# Patient Record
Sex: Male | Born: 1985 | Race: White | Hispanic: No | Marital: Single | Smoking: Current every day smoker
Health system: Southern US, Community
[De-identification: ages and names within clinical notes are randomized; demographics above are authoritative.]

## PROBLEM LIST (undated history)

## (undated) DIAGNOSIS — J45909 Unspecified asthma, uncomplicated: Secondary | ICD-10-CM

## (undated) HISTORY — PX: APPENDECTOMY: SHX54

## (undated) HISTORY — PX: WRIST SURGERY: SHX841

## (undated) HISTORY — PX: WISDOM TOOTH EXTRACTION: SHX21

---

## 2011-01-05 ENCOUNTER — Ambulatory Visit: Payer: Self-pay | Admitting: Family Medicine

## 2012-04-29 ENCOUNTER — Emergency Department: Payer: Self-pay | Admitting: Emergency Medicine

## 2012-04-29 LAB — URINALYSIS, COMPLETE
Bacteria: NONE SEEN
Ketone: NEGATIVE
Protein: NEGATIVE
RBC,UR: <1 /HPF (ref 0–5)
Specific Gravity: 1.016 (ref 1.003–1.030)
Squamous Epithelial: NONE SEEN

## 2012-04-29 LAB — COMPREHENSIVE METABOLIC PANEL
Albumin: 4.9 g/dL (ref 3.4–5.0)
Alkaline Phosphatase: 64 U/L (ref 50–136)
BUN: 13 mg/dL (ref 7–18)
Calcium, Total: 9.2 mg/dL (ref 8.5–10.1)
Creatinine: 0.88 mg/dL (ref 0.60–1.30)
EGFR (African American): >60
EGFR (Non-African Amer.): >60
Glucose: 82 mg/dL (ref 65–99)
Osmolality: 273 (ref 275–301)
SGPT (ALT): 23 U/L (ref 12–78)
Sodium: 137 mmol/L (ref 136–145)
Total Protein: 8.4 g/dL — ABNORMAL HIGH (ref 6.4–8.2)

## 2012-04-29 LAB — DRUG SCREEN, URINE
Amphetamines, Ur Screen: POSITIVE (ref ?–1000)
Barbiturates, Ur Screen: NEGATIVE (ref ?–200)
Benzodiazepine, Ur Scrn: POSITIVE (ref ?–200)
MDMA (Ecstasy)Ur Screen: NEGATIVE (ref ?–500)
Opiate, Ur Screen: NEGATIVE (ref ?–300)

## 2012-04-29 LAB — CBC
HCT: 44.9 % (ref 40.0–52.0)
HGB: 14.9 g/dL (ref 13.0–18.0)
MCH: 33.7 pg (ref 26.0–34.0)
RBC: 4.43 10*6/uL (ref 4.40–5.90)
WBC: 9 10*3/uL (ref 3.8–10.6)

## 2012-04-29 LAB — ETHANOL
Ethanol %: <0.003 % (ref 0.000–0.080)
Ethanol: <3 mg/dL

## 2012-04-29 LAB — TSH: Thyroid Stimulating Horm: 2.37 u[IU]/mL

## 2012-06-18 ENCOUNTER — Ambulatory Visit: Payer: Self-pay | Admitting: Psychiatry

## 2012-06-27 ENCOUNTER — Ambulatory Visit: Payer: Self-pay | Admitting: Psychiatry

## 2012-08-17 ENCOUNTER — Emergency Department: Payer: Self-pay | Admitting: Emergency Medicine

## 2012-08-17 LAB — DRUG SCREEN, URINE
Amphetamines, Ur Screen: POSITIVE (ref ?–1000)
Barbiturates, Ur Screen: NEGATIVE (ref ?–200)
Benzodiazepine, Ur Scrn: NEGATIVE (ref ?–200)
Cannabinoid 50 Ng, Ur ~~LOC~~: POSITIVE (ref ?–50)
Methadone, Ur Screen: POSITIVE (ref ?–300)
Opiate, Ur Screen: NEGATIVE (ref ?–300)

## 2012-08-17 LAB — COMPREHENSIVE METABOLIC PANEL
Anion Gap: 5 — ABNORMAL LOW (ref 7–16)
Bilirubin,Total: 0.6 mg/dL (ref 0.2–1.0)
Calcium, Total: 9.2 mg/dL (ref 8.5–10.1)
Chloride: 108 mmol/L — ABNORMAL HIGH (ref 98–107)
Creatinine: 0.82 mg/dL (ref 0.60–1.30)
EGFR (African American): >60
EGFR (Non-African Amer.): >60
Potassium: 3.7 mmol/L (ref 3.5–5.1)
SGOT(AST): 16 U/L (ref 15–37)
SGPT (ALT): 20 U/L (ref 12–78)
Sodium: 141 mmol/L (ref 136–145)
Total Protein: 7.8 g/dL (ref 6.4–8.2)

## 2012-08-17 LAB — URINALYSIS, COMPLETE
Glucose,UR: NEGATIVE mg/dL (ref 0–75)
Leukocyte Esterase: NEGATIVE
Nitrite: NEGATIVE
Ph: 6 (ref 4.5–8.0)
Protein: 100
RBC,UR: 5 /HPF (ref 0–5)

## 2012-08-17 LAB — SALICYLATE LEVEL: Salicylates, Serum: 2.4 mg/dL

## 2012-08-17 LAB — CBC
HCT: 37.3 % — ABNORMAL LOW (ref 40.0–52.0)
MCH: 34.1 pg — ABNORMAL HIGH (ref 26.0–34.0)
MCHC: 34.1 g/dL (ref 32.0–36.0)
Platelet: 176 10*3/uL (ref 150–440)
RBC: 3.73 10*6/uL — ABNORMAL LOW (ref 4.40–5.90)
WBC: 8.1 10*3/uL (ref 3.8–10.6)

## 2012-08-17 LAB — ACETAMINOPHEN LEVEL: Acetaminophen: <2 ug/mL

## 2012-08-17 LAB — ETHANOL: Ethanol: <3 mg/dL

## 2012-08-17 LAB — TSH: Thyroid Stimulating Horm: 0.44 u[IU]/mL — ABNORMAL LOW

## 2012-11-21 ENCOUNTER — Emergency Department: Payer: Self-pay | Admitting: Emergency Medicine

## 2012-11-24 ENCOUNTER — Emergency Department: Payer: Self-pay | Admitting: Internal Medicine

## 2013-10-17 ENCOUNTER — Emergency Department: Payer: Self-pay | Admitting: Student

## 2014-06-19 NOTE — Consult Note (Signed)
Brief Consult Note: Diagnosis: Polysubstance dependence,Status post overdose.   Recommend further assessment or treatment.   Comments: Mr. Alex Garrett was brought to ER with AMS. I attempted to interview him twice today. He is too sedated. Will attempt again tomorrow.  Electronic Signatures: Kristine LineaPucilowska, Kayleanna Lorman (MD)  (Signed 03-Mar-14 16:36)  Authored: Brief Consult Note   Last Updated: 03-Mar-14 16:36 by Kristine LineaPucilowska, Staisha Winiarski (MD)

## 2014-06-19 NOTE — Consult Note (Signed)
PATIENT NAME:  Alex Garrett, Alex Garrett MR#:  [id number] DATE OF BIRTH:  08/20/1985  DATE OF CONSULTATION:  08/17/2012  REFERRING PHYSICIAN:    CONSULTING PHYSICIAN:  Jadia Capers K. Guss Bundehalla, MD  PLACE OF DICTATION:  [facility], [facility], [place], [place].  AGE:  29 years.  SEX:  Male.  RACE:  White.  The patient was seen for assessment in [facility].    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  The patient is single, never married and lives with his parents who are both 29 years old and both are retired, mother being a Engineer, siteschool teacher and father worked in a [employer] company.  The patient was brought to the Emergency Room on IVC, escorted by police because he was very emotionally agitated.  The patient reports that he got into an argument with his father last night and he was very agitated and upset and probably aggressive for which father brought him to the Emergency Room.  According to information obtained from the charts ED physician had to give patient Haldol 5 mg IM to help him calm down.   PAST PSYCHIATRIC HISTORY:  The patient reports that he was being followed by Dr. Bethann PunchesMark Miller, primary PCP and gets Adderall 20 mg p.o. q. a.m. for being in school and to help him focus and pay attention.  He is on trazodone 50 mg p.o. at bedtime to help him rest.  In addition, he is on Prozac 20 mg p.o. daily.  According to information obtained from the chart, the patient was positive for amphetamines and THC.  The patient reports he is on Adderall which is the cause of urine drug screen being positive for amphetamines.  He smoked THC 2 weeks ago and does not smoke it on a regular basis.  Denies any other street or prescription drug abuse.  On one occasion several years ago he tried to overdose on Xanax which he got from the streets and was [facility] and was observed and let go.   MENTAL STATUS EXAMINATION:  Alert and oriented, pleasant and  cooperative.  No agitation.  Denies feeling depressed.  Denies feeling hopeless or helpless.  No psychosis.  Cognition intact.  General knowledge of information is fair.  Admits that he did have conflicts with his father for which he got agitated and which was the reason for him to be [facility].  Denies any ideas of plans to hurt himself or others and contracts for safety.   IMPRESSION:  Impulse control disorder, attention deficit disorder adult onset, depressive disorder, not otherwise specified, tetrahydrocannabinol abuse.  RECOMMENDATIONS:  The patient will be started back on his medications, that is Prozac 20 mg daily and trazodone 50 mg at bedtime.  The patient will be evaluated by Mr. Theodosia PalingKent Smith on Monday, 08/19/2012 for appropriate disposition and the parents will be called and discussed about the same.      ____________________________ Jannet MantisSurya K. Guss Bundehalla, MD skc:ea D: 08/17/2012 21:06:26 ET T: 08/18/2012 00:11:53 ET JOB#: [id number]  cc: Monika SalkSurya K. Guss Bundehalla, MD, <Dictator> Beau FannySURYA K Jakaya Jacobowitz MD ELECTRONICALLY SIGNED 08/18/2012 18:28

## 2014-06-19 NOTE — Consult Note (Signed)
PATIENT NAME:  Alex Garrett, Alex Garrett MR#:  [id number] DATE OF BIRTH:  1985/07/17  DATE OF CONSULTATION:  08/19/2012  CONSULTING PHYSICIAN:  Izola PriceFrances C. Jaclynn MajorGreason, MD  HISTORY OF PRESENT ILLNESS:  Alex Garrett is a 29 year old white male who was seen by Dr. Guss Bundehalla on 08/17/2012 secondary to him being brought to the Emergency Room on IVC escorted by police officers. He reported that he had gotten into an argument with his father and he was very agitated and upset and probably aggressive for which the father brought him to the ED. The ED physician gave him Haldol 5 mg IM to help him calm down. He was evaluated by Dr. Guss Bundehalla and I have reevaluated him this a.m. He reports that his PCP gives him Adderall to help him focus and pay attention. He is on trazodone at bedtime 50 mg to help him sleep. He is on Prozac 20 mg p.o. daily.   According to the chart, he was positive for amphetamines and THC. He reports that he takes the Adderall which is the amphetamines. He smoked THC 2 weeks ago and does not smoke it on a regular basis. He denies any other prescription drug or street drug. He reports that his father is sick with cancer and he worries about him a lot and he believes that the trazodone and Prozac helps him.   On my mental status examination, Alex Garrett is alert, oriented, focused and cooperative in the interview. There is absolutely no agitation or no suicidal ideation, intent or plan. There is no homicidal ideation, intent or plan. He denies any delusions or hallucinations. Thought processes are organized and goal directed. He reports that he has good family support. He likes to fish with his father and brother. He sometimes does needlepoint with his mother. He is cognitively intact. 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 He denies any plans to hurt himself or others and he contracts.   IMPRESSION:   1.  Impulse control disorder. 2.  Attention  deficit disorder, adult onset.  3.  Depressive disorder, not otherwise specified.  4.  Tetrahydrocannabinol abuse.   RECOMMENDATIONS:   1.  Continue his medicines, but I recommended that his Prozac be reduced to 10 mg p.o. daily and if he continues with these outbursts, he likely should be tapered off of it.  2.  If the patient remains stable and contracts and appointments are set up, he will be discharged to his parents.    ____________________________ Izola PriceFrances C. Jaclynn MajorGreason, MD fcg:si D: 08/19/2012 17:13:31 ET T: 08/19/2012 20:10:45 ET JOB#: [id number]  cc: Izola PriceFrances C. Jaclynn MajorGreason, MD, <Dictator> Maryan PulsFRANCES C Curly Mackowski MD ELECTRONICALLY SIGNED 08/29/2012 12:03

## 2014-06-19 NOTE — Consult Note (Signed)
Brief Consult Note: Diagnosis: Impulse Control DO, ADD by HX.   Patient was seen by consultant.   Consult note dictated.   Recommend further assessment or treatment.   Comments: Pt admitted after he took extra pills of xanax and adderall. He stated that went fishing with girl friend. he stated that he became too sedated and she was cared so she called 911. he was prescribed Adderall in the past by his PCP Dr [place]ann GibbsFinch [place] [facility] [place] when he will was living with his mother. However, he has now relocated with his father and was given Adderall XR 20mg  po qdaily. Pt is unable to afford the medications and he has stopped the pills. 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 Has the relationship for the past 1 year. Stated that he does not use other drugs or alcohol.  he is not having SI or Plans.   MSE- Beverely Pacehinly built male appeared younger than stated age. calm and cooperative. Maintained good eye contact. speech was low in tone and volume. Mood was fine, affect was congruent, tp- logical goal directed, tc- non delusional. Denied SI/HI or plans.   DX- Impulse Control DO ADD by HX  Plan; Will d/C IVC Pt contracted for safety. Will be discharged home and he will follow with [facility].  He agreed with plan.  No acute issues noted.  No new meds at this time..  Electronic Signatures: Rhunette CroftFaheem, Uzma S (MD)  (Signed 04-Mar-14 13:26)  Authored: Brief Consult Note   Last Updated: 04-Mar-14 13:26 by Rhunette CroftFaheem, Uzma S (MD)

## 2014-09-25 IMAGING — CR DG CHEST 1V PORT
1 series · 2 of 2 positions shown · non-contrast
Comparison: none

REASON FOR EXAM: OVERDOSE
COMMENTS:

PROCEDURE:     DXR - DXR PORTABLE CHEST SINGLE VIEW  - April 29, 2012  [DATE]
RESULT:     The lungs are clear. The heart and pulmonary vessels are normal.
The bony and mediastinal structures are unremarkable. There is no effusion.
There is no pneumothorax or evidence of congestive failure.

[Series 1: ap · 0.17mm/px · 2 of 2 slices shown]
[im 1/2]
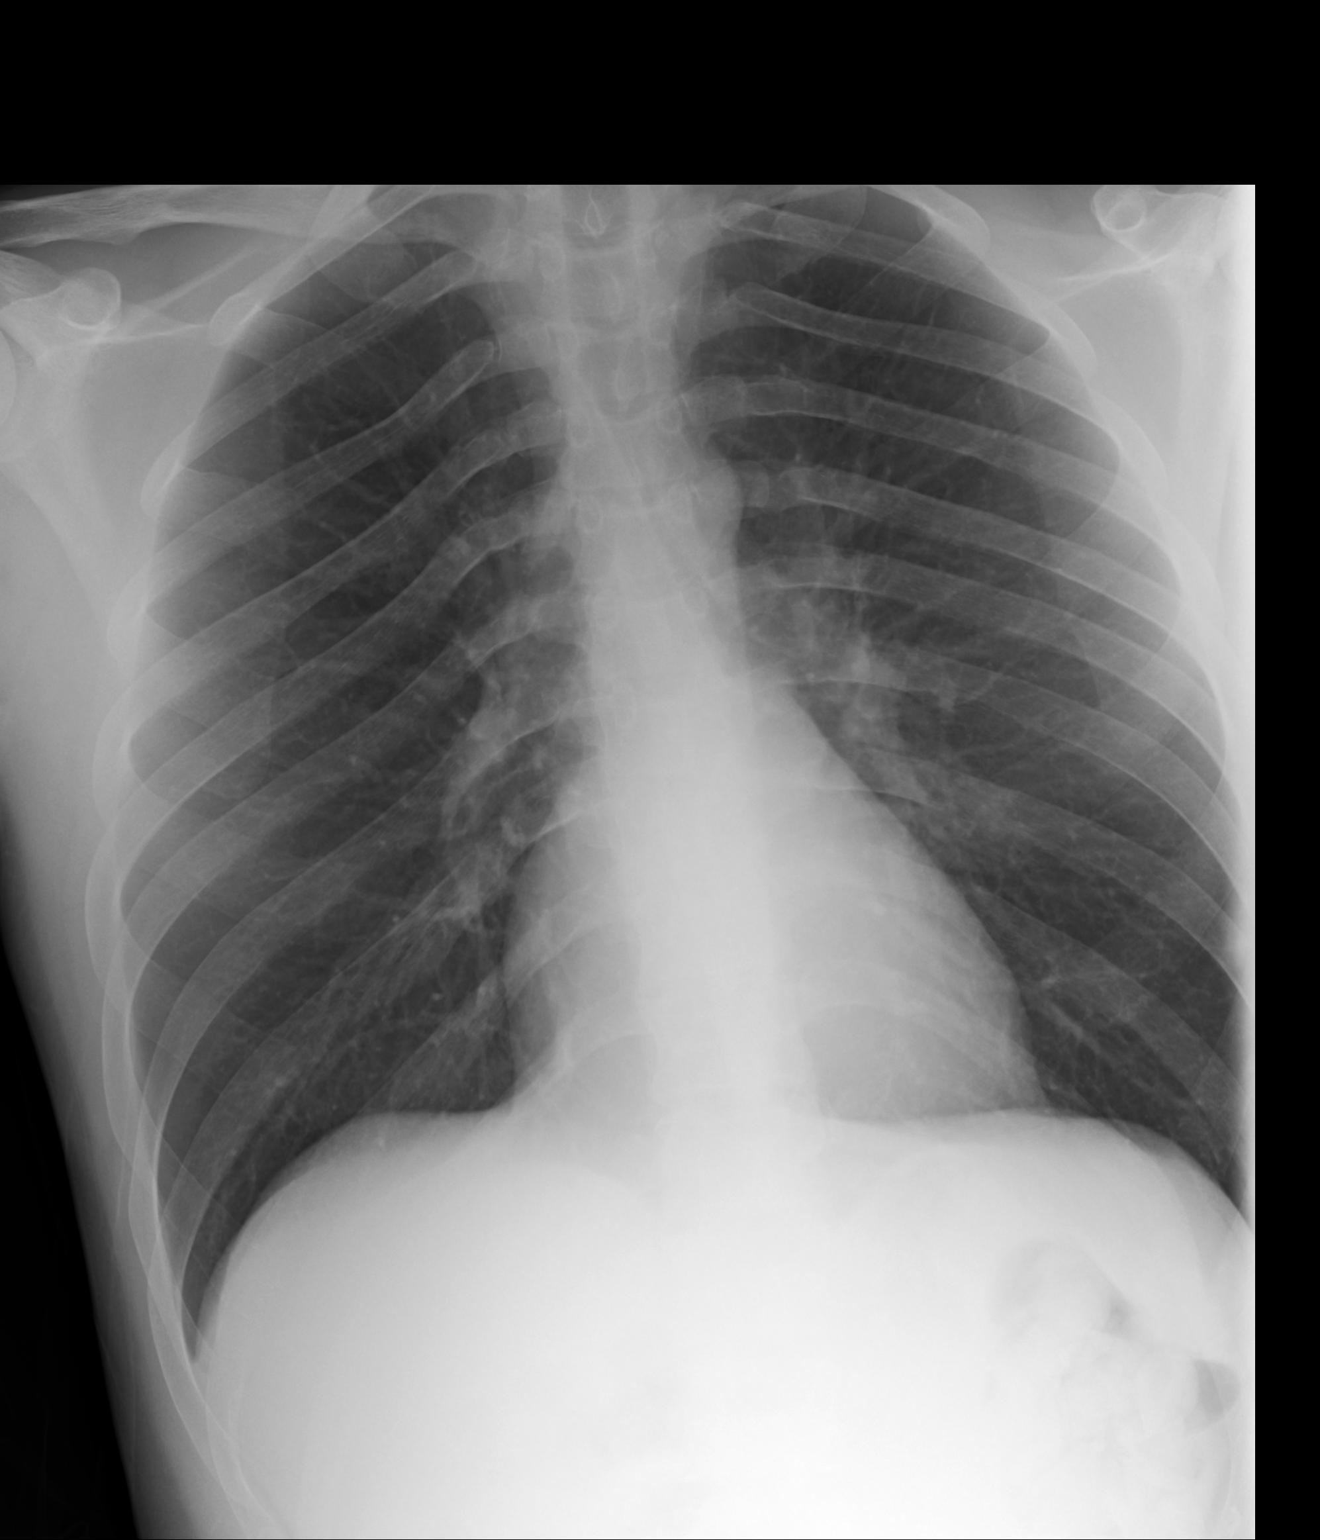
[im 2/2]
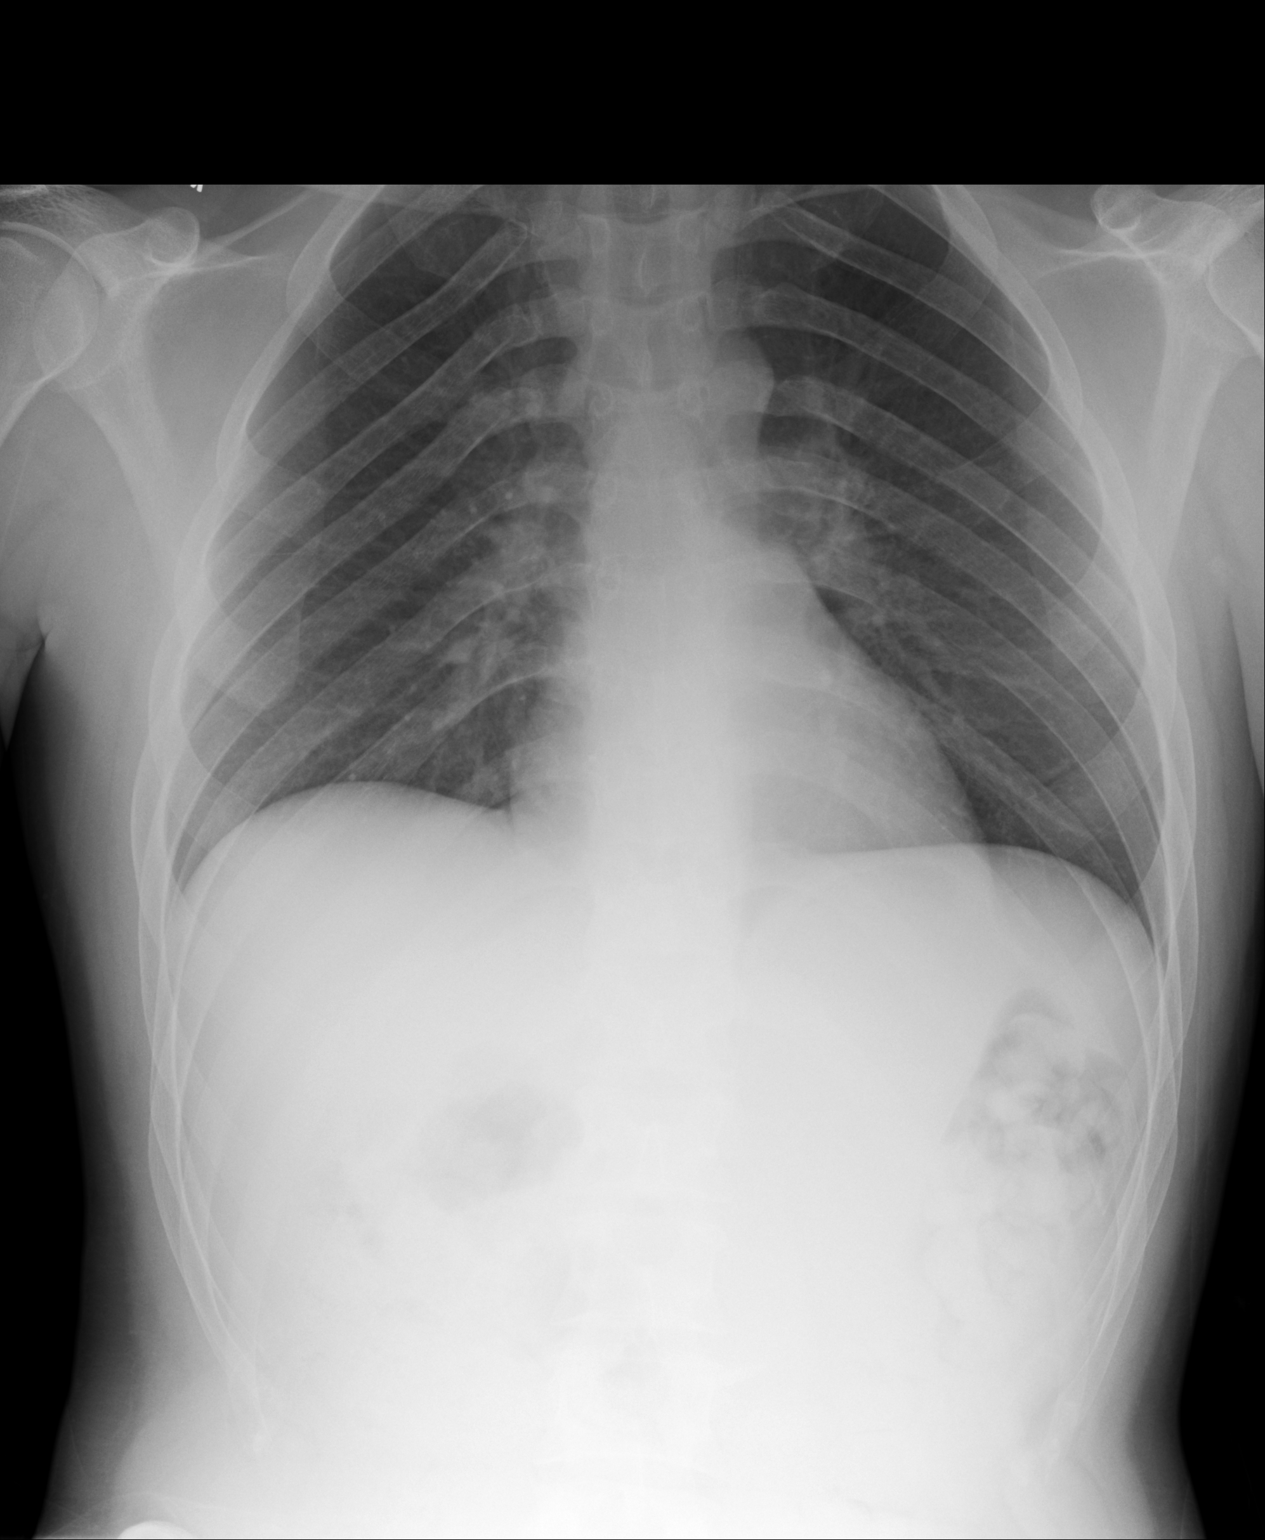

[2 of 2 positions shown; findings below may reference images not displayed]

IMPRESSION: No acute cardiopulmonary disease.

[REDACTED]

## 2014-10-18 ENCOUNTER — Encounter: Payer: Self-pay | Admitting: *Deleted

## 2014-10-18 ENCOUNTER — Emergency Department: Payer: Self-pay

## 2014-10-18 ENCOUNTER — Emergency Department
Admission: EM | Admit: 2014-10-18 | Discharge: 2014-10-18 | Disposition: A | Discharge status: Home / Self Care | Payer: Self-pay | Attending: Emergency Medicine | Admitting: Emergency Medicine

## 2014-10-18 DIAGNOSIS — Y998 Other external cause status: Secondary | ICD-10-CM | POA: Insufficient documentation

## 2014-10-18 DIAGNOSIS — W010XXA Fall on same level from slipping, tripping and stumbling without subsequent striking against object, initial encounter: Secondary | ICD-10-CM | POA: Insufficient documentation

## 2014-10-18 DIAGNOSIS — Y9389 Activity, other specified: Secondary | ICD-10-CM | POA: Insufficient documentation

## 2014-10-18 DIAGNOSIS — S0990XA Unspecified injury of head, initial encounter: Secondary | ICD-10-CM | POA: Insufficient documentation

## 2014-10-18 DIAGNOSIS — K0889 Other specified disorders of teeth and supporting structures: Secondary | ICD-10-CM

## 2014-10-18 DIAGNOSIS — S161XXA Strain of muscle, fascia and tendon at neck level, initial encounter: Secondary | ICD-10-CM | POA: Insufficient documentation

## 2014-10-18 DIAGNOSIS — K088 Other specified disorders of teeth and supporting structures: Secondary | ICD-10-CM | POA: Insufficient documentation

## 2014-10-18 DIAGNOSIS — Z720 Tobacco use: Secondary | ICD-10-CM | POA: Insufficient documentation

## 2014-10-18 DIAGNOSIS — Y9289 Other specified places as the place of occurrence of the external cause: Secondary | ICD-10-CM | POA: Insufficient documentation

## 2014-10-18 HISTORY — DX: Unspecified asthma, uncomplicated: J45.909

## 2014-10-18 MED ORDER — AMOXICILLIN 500 MG PO CAPS: 500.0000 mg | ORAL_CAPSULE | Freq: Three times a day (TID) | ORAL | Status: DC

## 2014-10-18 MED ORDER — CYCLOBENZAPRINE HCL 10 MG PO TABS: 10.0000 mg | ORAL_TABLET | Freq: Three times a day (TID) | ORAL | Status: DC | PRN

## 2014-10-18 NOTE — ED Provider Notes (Signed)
[facility] Emergency Department Provider Note  ____________________________________________  Time seen: Approximately 5:14 PM  I have reviewed the triage vital signs and the nursing notes.   HISTORY  Chief Complaint Back Pain    HPI Alex Garrett is a 29 y.o. male patient complaining of posterior frontal head pain and vertigo. Patient also complaining of neck pain. Patient say he slipped and fell his neurologist 2 months ago with loss of consciousness but since then he's experiencing neck pain and intermittent vertigo. Patient also complaining of sinus congestion and dental pain. Patient stated he had tooth #17 removed last month. Patient states since the procedure he noticed increased gum swelling and pain. Patient is rating his pain as a 6/10. Past Medical History  Diagnosis Date  . Asthma     There are no active problems to display for this patient.   Past Surgical History  Procedure Laterality Date  . Wrist surgery    . Appendectomy    . Wisdom tooth extraction      No current outpatient prescriptions on file.  Allergies Review of patient's allergies indicates no known allergies.  No family history on file.  Social History Social History  Substance Use Topics  . Smoking status: Current Every Day Smoker  . Smokeless tobacco: None  . Alcohol Use: Yes    Review of Systems Constitutional: No fever/chills Eyes: No visual changes. ENT: No sore throat. Cardiovascular: Denies chest pain. Respiratory: Denies shortness of breath. Gastrointestinal: No abdominal pain.  No nausea, no vomiting.  No diarrhea.  No constipation. Genitourinary: Negative for dysuria. Musculoskeletal: Neck pain.. Skin: Negative for rash. Neurological: Frontal and posterior  Headaches, but denies focal weakness or numbness. States intermitting vertigo. 10-point ROS otherwise negative.  ____________________________________________   PHYSICAL EXAM:  VITAL  SIGNS: ED Triage Vitals  Enc Vitals Group     BP 10/18/14 1650 151/90 mmHg     Pulse Rate 10/18/14 1650 105     Resp 10/18/14 1650 18     Temp 10/18/14 1650 98.2 F (36.8 C)     Temp Source 10/18/14 1650 Oral     SpO2 10/18/14 1650 98 %     Weight 10/18/14 1650 148 lb (67.132 kg)     Height 10/18/14 1650  (1.651 m)     Head Cir --      Peak Flow --      Pain Score 10/18/14 1656 6     Pain Loc --      Pain Edu? --      Excl. in GC? --     Constitutional: Alert and oriented. Well appearing and in no acute distress. Eyes: Conjunctivae are normal. PERRL. EOMI. Head: Atraumatic. Nose: No congestion/rhinnorhea. Mouth/Throat: Mucous membranes are moist.  Oropharynx non-erythematous. Gingival edema and tooth #17. Neck: No stridor. Positive cervical spine tenderness to palpation at cervical process #6. Hematological/Lymphatic/Immunilogical: No cervical lymphadenopathy. Cardiovascular: Normal rate, regular rhythm. Grossly normal heart sounds.  Good peripheral circulation. Respiratory: Normal respiratory effort.  No retractions. Lungs CTAB. Gastrointestinal: Soft and nontender. No distention. No abdominal bruits. No CVA tenderness. Musculoskeletal: No lower extremity tenderness nor edema.  No joint effusions. Neurologic:  Normal speech and language. No gross focal neurologic deficits are appreciated. No gait instability. Skin:  Skin is warm, dry and intact. No rash noted. Psychiatric: Mood and affect are normal. Speech and behavior are normal.  ____________________________________________   LABS (all labs ordered are listed, but only abnormal results are displayed)  Labs Reviewed -  No data to display ____________________________________________  EKG   ____________________________________________  RADIOLOGY  CT head and neck unremarkable. ____________________________________________   PROCEDURES  Procedure(s) performed: None  Critical Care performed:  No  ____________________________________________   INITIAL IMPRESSION / ASSESSMENT AND PLAN / ED COURSE  Pertinent labs & imaging results that were available during my care of the patient were reviewed by me and considered in my medical decision making (see chart for details).  Cervical strain. Dental pain. No acute findings the CT results with patient. ____________________________________________   FINAL CLINICAL IMPRESSION(S) / ED DIAGNOSES  Final diagnoses:  Cervical strain, acute, initial encounter  Pain, dental      Joni Reining, PA-C 10/18/14 1914  Loleta Rose, MD 10/18/14 2351

## 2014-10-18 NOTE — Discharge Instructions (Signed)
Advised to follow up with treating Dentist.

## 2014-10-18 NOTE — ED Notes (Signed)
Pt ambulatory to triage with steady gait. Pt states that he has a pain in his upper spine (has been off and on since March-pain really started back in May when he fell and hit his head). Pt also wants to be evaluated for pain in his left jaw (states his wisdom teeth were removed in July, has become worse over the past several days.)

## 2014-11-11 ENCOUNTER — Other Ambulatory Visit: Payer: Self-pay | Admitting: Internal Medicine

## 2014-11-11 DIAGNOSIS — M5010 Cervical disc disorder with radiculopathy, unspecified cervical region: Secondary | ICD-10-CM

## 2014-11-14 ENCOUNTER — Ambulatory Visit: Admission: RE | Admit: 2014-11-14 | Payer: Self-pay | Source: Ambulatory Visit

## 2015-02-16 ENCOUNTER — Emergency Department
Admission: EM | Admit: 2015-02-16 | Discharge: 2015-02-16 | Disposition: A | Discharge status: Home / Self Care | Payer: Self-pay | Attending: Emergency Medicine | Admitting: Emergency Medicine

## 2015-02-16 ENCOUNTER — Encounter: Payer: Self-pay | Admitting: Urgent Care

## 2015-02-16 DIAGNOSIS — R079 Chest pain, unspecified: Secondary | ICD-10-CM

## 2015-02-16 DIAGNOSIS — R6883 Chills (without fever): Secondary | ICD-10-CM

## 2015-02-16 DIAGNOSIS — R05 Cough: Secondary | ICD-10-CM | POA: Insufficient documentation

## 2015-02-16 DIAGNOSIS — M791 Myalgia: Secondary | ICD-10-CM | POA: Insufficient documentation

## 2015-02-16 DIAGNOSIS — F17200 Nicotine dependence, unspecified, uncomplicated: Secondary | ICD-10-CM | POA: Insufficient documentation

## 2015-02-16 DIAGNOSIS — R109 Unspecified abdominal pain: Secondary | ICD-10-CM

## 2015-02-16 DIAGNOSIS — R202 Paresthesia of skin: Secondary | ICD-10-CM | POA: Insufficient documentation

## 2015-02-16 DIAGNOSIS — R52 Pain, unspecified: Secondary | ICD-10-CM

## 2015-02-16 DIAGNOSIS — M542 Cervicalgia: Secondary | ICD-10-CM | POA: Insufficient documentation

## 2015-02-16 DIAGNOSIS — R059 Cough, unspecified: Secondary | ICD-10-CM

## 2015-02-16 DIAGNOSIS — Z792 Long term (current) use of antibiotics: Secondary | ICD-10-CM | POA: Insufficient documentation

## 2015-02-16 LAB — CBC
HEMATOCRIT: 46.5 % (ref 40.0–52.0)
HEMOGLOBIN: 15.3 g/dL (ref 13.0–18.0)
MCH: 34 pg (ref 26.0–34.0)
MCHC: 33 g/dL (ref 32.0–36.0)
MCV: 103 fL — AB (ref 80.0–100.0)
Platelets: 244 10*3/uL (ref 150–440)
RBC: 4.51 MIL/uL (ref 4.40–5.90)
RDW: 13.6 % (ref 11.5–14.5)
WBC: 9.6 10*3/uL (ref 3.8–10.6)

## 2015-02-16 LAB — URINALYSIS COMPLETE WITH MICROSCOPIC (ARMC ONLY)
BACTERIA UA: NONE SEEN
BILIRUBIN URINE: NEGATIVE
GLUCOSE, UA: NEGATIVE mg/dL
Hgb urine dipstick: NEGATIVE
Ketones, ur: NEGATIVE mg/dL
Leukocytes, UA: NEGATIVE
Nitrite: NEGATIVE
Protein, ur: NEGATIVE mg/dL
SQUAMOUS EPITHELIAL / LPF: NONE SEEN
Specific Gravity, Urine: 1.01 (ref 1.005–1.030)
pH: 6 (ref 5.0–8.0)

## 2015-02-16 LAB — COMPREHENSIVE METABOLIC PANEL
ALK PHOS: 56 U/L (ref 38–126)
ALT: 16 U/L — AB (ref 17–63)
AST: 20 U/L (ref 15–41)
Albumin: 5.4 g/dL — ABNORMAL HIGH (ref 3.5–5.0)
Anion gap: 5 (ref 5–15)
BILIRUBIN TOTAL: 0.4 mg/dL (ref 0.3–1.2)
BUN: 9 mg/dL (ref 6–20)
CALCIUM: 9.4 mg/dL (ref 8.9–10.3)
CO2: 28 mmol/L (ref 22–32)
CREATININE: 0.72 mg/dL (ref 0.61–1.24)
Chloride: 105 mmol/L (ref 101–111)
GFR calc Af Amer: >60 mL/min (ref >60)
Glucose, Bld: 106 mg/dL — ABNORMAL HIGH (ref 65–99)
POTASSIUM: 4.2 mmol/L (ref 3.5–5.1)
Sodium: 138 mmol/L (ref 135–145)
TOTAL PROTEIN: 8.7 g/dL — AB (ref 6.5–8.1)

## 2015-02-16 LAB — CK: CK TOTAL: 65 U/L (ref 49–397)

## 2015-02-16 NOTE — ED Notes (Signed)
Stafford,MD consulted. MD made aware of presenting complaints and triage assessment. MD with VORB for: CBC, CMP, CK, UA, and EKG. Orders to be entered and carried by this RN.

## 2015-02-16 NOTE — Discharge Instructions (Signed)

## 2015-02-16 NOTE — ED Notes (Signed)
Patient presents with a constellation of symptoms that have been intermittently present x 1 year. PCP is Hyacinth MeekerMiller and he has been unable to make a diagnosis; reports that MD thinks it is a sinus infection, muscle spasms, or nerve damage. Patient is reporting multiple organ systems. Patient with c/o numbness to LEFT side of his head, LEFT side CP, LLQ abd pain, difficulty urinating with "cloudy" urine, pain in his extremities and neck. Symptoms come and go and he is not experiencing them all at this point.

## 2015-02-16 NOTE — ED Provider Notes (Addendum)
[facility] Emergency Department Provider Note  ____________________________________________  Time seen: Approximately 945 PM  I have reviewed the triage vital signs and the nursing notes.   HISTORY  Chief Complaint Generalized Body Aches; Abdominal Pain; and Neck Pain    HPI Alex Garrett is a 29 y.o. male with a history of asthma who is presenting today with multiple complaints. He says for about the past year he has had intermittent abdominal pain to the left side of his abdomen. Chest pain. Cough. Cloudy urine as well as muscle aches diffusely. He says that he came in today because he is also been having chills for the past year that have worsened over the past day. His girlfriend is at the bedside and said he had a subjective fever as well. The patient has had no Tylenol or ibuprofen earlier today. Also states a heaviness to the right side of his face without any explicit numbness or tingling. He suspects that this may be from past head trauma. He says he has had an intracranial hemorrhage on the past when he fell off a roof. The patient also has a history of IV drug use and says he has not used over the past 6 months.He says that both him and his girlfriend have tested negative for hep C as well as HIV since he stopped using drugs. Patient is also concerned because of a history of blood cancers in his family. Says that he has had intermittently swollen lymph nodes in his neck.   Past Medical History  Diagnosis Date  . Asthma     There are no active problems to display for this patient.   Past Surgical History  Procedure Laterality Date  . Wrist surgery    . Appendectomy    . Wisdom tooth extraction      Current Outpatient Rx  Name  Route  Sig  Dispense  Refill  . amoxicillin (AMOXIL) 500 MG capsule   Oral   Take 1 capsule (500 mg total) by mouth 3 (three) times daily.   30 capsule   0   . cyclobenzaprine (FLEXERIL) 10 MG tablet   Oral  Take 1 tablet (10 mg total) by mouth every 8 (eight) hours as needed for muscle spasms.   15 tablet   0     Allergies Review of patient's allergies indicates no known allergies.  No family history on file.  Social History Social History  Substance Use Topics  . Smoking status: Current Every Day Smoker  . Smokeless tobacco: None  . Alcohol Use: Yes    Review of Systems Constitutional: No fever/chills Eyes: No visual changes. ENT: No sore throat. Cardiovascular: Intermittent left-sided chest pain which is nonradiating and does not have any at this time.  Respiratory: Intermittent nonproductive cough. Gastrointestinal: no vomiting.  No diarrhea.  No constipation. Genitourinary: Negative for dysuria. Musculoskeletal: Diffuse muscle aches. Skin: Negative for rash. Neurological: Negative for  focal weakness or numbness.  10-point ROS otherwise negative.  ____________________________________________   PHYSICAL EXAM:  VITAL SIGNS: ED Triage Vitals  Enc Vitals Group     BP 02/16/15 1934 139/90 mmHg     Pulse Rate 02/16/15 1934 100     Resp 02/16/15 1934 16     Temp 02/16/15 1934 98.4 F (36.9 C)     Temp Source 02/16/15 1934 Oral     SpO2 02/16/15 1934 99 %     Weight 02/16/15 1934 145 lb (65.772 kg)     Height  02/16/15 1934 5\' 11"  (1.803 m)     Head Cir --      Peak Flow --      Pain Score 02/16/15 1934 4     Pain Loc --      Pain Edu? --      Excl. in GC? --     Constitutional: Alert and oriented. Well appearing and in no acute distress. Eyes: Conjunctivae are normal. PERRL. EOMI. Head: Atraumatic. Nose: No congestion/rhinnorhea. Mouth/Throat: Mucous membranes are moist.  Oropharynx non-erythematous. Neck: No stridor.   Hematological/Lymphatic/Immunilogical: No cervical lymphadenopathy. Cardiovascular: Normal rate, regular rhythm. Grossly normal heart sounds.  Good peripheral circulation. No spliter hemorrhages or Osler's nodes. Respiratory: Normal  respiratory effort.  No retractions. Lungs CTAB. Gastrointestinal: Soft and nontender. No distention. No abdominal bruits. No CVA tenderness. Musculoskeletal: No lower extremity tenderness nor edema.  No joint effusions. Neurologic:  Normal speech and language. No gross focal neurologic deficits are appreciated. No gait instability. Sensation intact to light touch to bilateral face. Skin:  Skin is warm, dry and intact. No rash noted. Psychiatric: Mood and affect are normal. Speech and behavior are normal.  ____________________________________________   LABS (all labs ordered are listed, but only abnormal results are displayed)  Labs Reviewed  CBC - Abnormal; Notable for the following:    MCV 103.0 (*)    All other components within normal limits  COMPREHENSIVE METABOLIC PANEL - Abnormal; Notable for the following:    Glucose, Bld 106 (*)    Total Protein 8.7 (*)    Albumin 5.4 (*)    ALT 16 (*)    All other components within normal limits  URINALYSIS COMPLETEWITH MICROSCOPIC ([facility] ONLY) - Abnormal; Notable for the following:    Color, Urine STRAW (*)    APPearance CLEAR (*)    All other components within normal limits  CULTURE, BLOOD (ROUTINE X 2)  CULTURE, BLOOD (ROUTINE X 2)  CK   ____________________________________________  EKG  ED ECG REPORT I, Arelia LongestSchaevitz,  Nikkia Devoss M, the attending physician, personally viewed and interpreted this ECG.   Date: 02/16/2015  EKG Time: 1943  Rate: 87  Rhythm: normal sinus rhythm  Axis: Rightward axis  Intervals:none  ST&T Change: No ST elevation or depressions. No abnormal T-wave inversions.  ____________________________________________  RADIOLOGY   ____________________________________________   PROCEDURES   ____________________________________________   INITIAL IMPRESSION / ASSESSMENT AND PLAN / ED COURSE  Pertinent labs & imaging results that were available during my care of the patient were reviewed by me and considered  in my medical decision making (see chart for details).  Patient with reassuring exam as well as lab workup. I did draw blood cultures because of concern for endocarditis because of the increasing chills. However, the patient did not a fever here. Does not have an elevated white blood cell count. Recommended that the patient follow-up with Dr. Hyacinth MeekerMiller. I do not see reason to keep him in the emergency department for further workup at this time. I believe he may be an outpatient. I cleaned this plan with patient as well as my concerns and he understands the plan and is willing to comply. ____________________________________________   FINAL CLINICAL IMPRESSION(S) / ED DIAGNOSES  Chills. Abdominal pain. Paresthesia. Chest pain. Cough. Body aches.    Myrna Blazeravid Matthew Kandi Brusseau, MD 02/16/15 2213  Myrna Blazeravid Matthew Obe Ahlers, MD 02/16/15 2214

## 2015-02-21 LAB — CULTURE, BLOOD (ROUTINE X 2)
CULTURE: NO GROWTH
Culture: NO GROWTH

## 2017-02-01 ENCOUNTER — Emergency Department: Payer: Self-pay

## 2017-02-01 ENCOUNTER — Emergency Department
Admission: EM | Admit: 2017-02-01 | Discharge: 2017-02-02 | Discharge status: Against Medical Advice | Payer: Self-pay | Attending: Student in an Organized Health Care Education/Training Program | Admitting: Student in an Organized Health Care Education/Training Program

## 2017-02-01 DIAGNOSIS — R002 Palpitations: Secondary | ICD-10-CM | POA: Insufficient documentation

## 2017-02-01 DIAGNOSIS — F1510 Other stimulant abuse, uncomplicated: Secondary | ICD-10-CM | POA: Insufficient documentation

## 2017-02-01 DIAGNOSIS — J45909 Unspecified asthma, uncomplicated: Secondary | ICD-10-CM | POA: Insufficient documentation

## 2017-02-01 DIAGNOSIS — Z5320 Procedure and treatment not carried out because of patient's decision for unspecified reasons: Secondary | ICD-10-CM | POA: Insufficient documentation

## 2017-02-01 DIAGNOSIS — F17200 Nicotine dependence, unspecified, uncomplicated: Secondary | ICD-10-CM | POA: Insufficient documentation

## 2017-02-01 LAB — BASIC METABOLIC PANEL
ANION GAP: 12 (ref 5–15)
BUN: 12 mg/dL (ref 6–20)
CALCIUM: 9.5 mg/dL (ref 8.9–10.3)
CHLORIDE: 101 mmol/L (ref 101–111)
CO2: 25 mmol/L (ref 22–32)
Creatinine, Ser: 0.9 mg/dL (ref 0.61–1.24)
GFR calc Af Amer: >60 mL/min (ref >60)
GFR calc non Af Amer: >60 mL/min (ref >60)
GLUCOSE: 111 mg/dL — AB (ref 65–99)
Potassium: 3.8 mmol/L (ref 3.5–5.1)
Sodium: 138 mmol/L (ref 135–145)

## 2017-02-01 LAB — URINALYSIS, COMPLETE (UACMP) WITH MICROSCOPIC
BACTERIA UA: NONE SEEN
Bilirubin Urine: NEGATIVE
Glucose, UA: NEGATIVE mg/dL
Hgb urine dipstick: NEGATIVE
KETONES UR: NEGATIVE mg/dL
LEUKOCYTES UA: NEGATIVE
Nitrite: NEGATIVE
PROTEIN: NEGATIVE mg/dL
SQUAMOUS EPITHELIAL / LPF: NONE SEEN
Specific Gravity, Urine: 1.004 — ABNORMAL LOW (ref 1.005–1.030)
pH: 7 (ref 5.0–8.0)

## 2017-02-01 LAB — CBC
HEMATOCRIT: 40.3 % (ref 40.0–52.0)
HEMOGLOBIN: 13.8 g/dL (ref 13.0–18.0)
MCH: 33.8 pg (ref 26.0–34.0)
MCHC: 34.2 g/dL (ref 32.0–36.0)
MCV: 99 fL (ref 80.0–100.0)
Platelets: 205 10*3/uL (ref 150–440)
RBC: 4.07 MIL/uL — ABNORMAL LOW (ref 4.40–5.90)
RDW: 12.9 % (ref 11.5–14.5)
WBC: 8.6 10*3/uL (ref 3.8–10.6)

## 2017-02-01 LAB — TROPONIN I: Troponin I: <0.03 ng/mL (ref <0.03)

## 2017-02-01 MED ORDER — LORAZEPAM 2 MG/ML IJ SOLN
1.0000 mg | Freq: Once | INTRAMUSCULAR | Status: AC
  Administered 2017-02-01: 1 mg via INTRAVENOUS

## 2017-02-01 MED ORDER — LORAZEPAM 2 MG/ML IJ SOLN
INTRAMUSCULAR | Status: AC
  Filled 2017-02-01: qty 1

## 2017-02-01 MED ORDER — LORAZEPAM 2 MG/ML IJ SOLN
2.0000 mg | Freq: Once | INTRAMUSCULAR | Status: AC
  Administered 2017-02-01: 2 mg via INTRAVENOUS
  Filled 2017-02-01: qty 1

## 2017-02-01 MED ORDER — LORAZEPAM 1 MG PO TABS
1.0000 mg | ORAL_TABLET | Freq: Once | ORAL | Status: AC
  Administered 2017-02-01: 1 mg via ORAL
  Filled 2017-02-01: qty 1

## 2017-02-01 MED ORDER — SODIUM CHLORIDE 0.9 % IV BOLUS (SEPSIS)
1000.0000 mL | Freq: Once | INTRAVENOUS | Status: AC
  Administered 2017-02-01: 1000 mL via INTRAVENOUS

## 2017-02-01 NOTE — ED Notes (Signed)
Pt given sandwich tray by Bernette RedbirdKenny, RN

## 2017-02-01 NOTE — ED Provider Notes (Signed)
[facility] Emergency Department Provider Note    First MD Initiated Contact with Patient 02/01/17 [phone]     (approximate)  I have reviewed the triage vital signs and the nursing notes.   HISTORY  Chief Complaint Palpitations and Near Syncope    HPI Alex Garrett is a 31 y.o. male with a history of polysubstance abuse presents with chief complaint of palpitations shortness of breath jitteriness and neck pain started this afternoon after he took 75 mg of Adderall.  Patient does have a prescription for Adderall chronically but those are 10 mg that he takes twice daily.  Father is with him and states that he has had a long history of substance abuse issues and frequently runs through his prescription of Adderall before his refill is allowed.  Patient was able to get hold of some other prescription today and took 3 pills of 25 mg.  He also took Suboxone today.  Patient denies any other substance abuse.  No alcohol use.  No intent for self-harm.  States he is stressed out and trying [place]  Past Medical History:  Diagnosis Date  . Asthma    No family history on file. Past Surgical History:  Procedure Laterality Date  . APPENDECTOMY    . WISDOM TOOTH EXTRACTION    . WRIST SURGERY     There are no active problems to display for this patient.     Prior to Admission medications   Medication Sig Start Date End Date Taking? Authorizing Provider  amoxicillin (AMOXIL) 500 MG capsule Take 1 capsule (500 mg total) by mouth 3 (three) times daily. 10/18/14   Joni ReiningSmith, Ronald K, PA-C  cyclobenzaprine (FLEXERIL) 10 MG tablet Take 1 tablet (10 mg total) by mouth every 8 (eight) hours as needed for muscle spasms. 10/18/14   Joni ReiningSmith, Ronald K, PA-C    Allergies Patient has no known allergies.    Social History Social History   Tobacco Use  . Smoking status: Current Every Day Smoker  Substance Use Topics  . Alcohol  use: Yes  . Drug use: No    Review of Systems Patient denies headaches, rhinorrhea, blurry vision, numbness, shortness of breath, chest pain, edema, cough, abdominal pain, nausea, vomiting, diarrhea, dysuria, fevers, rashes or hallucinations unless otherwise stated above in HPI. ____________________________________________   PHYSICAL EXAM:  VITAL SIGNS: Vitals:   02/01/17 2300 02/01/17 2303  BP:  113/86  Pulse: (!) 128 (!) 131  Resp: 20 15  Temp:    SpO2: 93% 100%    Constitutional: Alert and oriented. Anxious appearing but in NAD Eyes: Conjunctivae are normal.  Head: Atraumatic. Nose: No congestion/rhinnorhea. Mouth/Throat: Mucous membranes are moist.   Neck: No stridor. Painless ROM.  Cardiovascular: tachycardia, regular rhythm. Grossly normal heart sounds.  Good peripheral circulation. Respiratory: Normal respiratory effort.  No retractions. Lungs CTAB. Gastrointestinal: Soft and nontender. No distention. No abdominal bruits. No CVA tenderness. Genitourinary: deferred Musculoskeletal: No lower extremity tenderness nor edema.  No joint effusions. Neurologic:  Normal speech and language. No gross focal neurologic deficits are appreciated. No facial droop.  No tremor Skin:  Skin is warm, dry and intact. No rash noted. Psychiatric:anxious but organized thought process ____________________________________________   LABS (all labs ordered are listed, but only abnormal results are displayed)  Results for orders placed or performed during the hospital encounter of 02/01/17 (from the past 24 hour(s))  Basic metabolic panel     Status: Abnormal  Collection Time: 02/01/17  9:25 PM  Result Value Ref Range   Sodium 138 135 - 145 mmol/L   Potassium 3.8 3.5 - 5.1 mmol/L   Chloride 101 101 - 111 mmol/L   CO2 25 22 - 32 mmol/L   Glucose, Bld 111 (H) 65 - 99 mg/dL   BUN 12 6 - 20 mg/dL   Creatinine, Ser 1.610.90 0.61 - 1.24 mg/dL   Calcium 9.5 8.9 - 09.610.3 mg/dL   GFR calc non Af Amer  >60 >60 mL/min   GFR calc Af Amer >60 >60 mL/min   Anion gap 12 5 - 15  CBC     Status: Abnormal   Collection Time: 02/01/17  9:25 PM  Result Value Ref Range   WBC 8.6 3.8 - 10.6 K/uL   RBC 4.07 (L) 4.40 - 5.90 MIL/uL   Hemoglobin 13.8 13.0 - 18.0 g/dL   HCT 04.540.3 40.940.0 - 81.152.0 %   MCV 99.0 80.0 - 100.0 fL   MCH 33.8 26.0 - 34.0 pg   MCHC 34.2 32.0 - 36.0 g/dL   RDW 91.412.9 78.211.5 - 95.614.5 %   Platelets 205 150 - 440 K/uL  Troponin I     Status: None   Collection Time: 02/01/17  9:25 PM  Result Value Ref Range   Troponin I <0.03 <0.03 ng/mL  Urinalysis, Complete w Microscopic     Status: Abnormal   Collection Time: 02/01/17  9:26 PM  Result Value Ref Range   Color, Urine STRAW (A) YELLOW   APPearance CLEAR (A) CLEAR   Specific Gravity, Urine 1.004 (L) 1.005 - 1.030   pH 7.0 5.0 - 8.0   Glucose, UA NEGATIVE NEGATIVE mg/dL   Hgb urine dipstick NEGATIVE NEGATIVE   Bilirubin Urine NEGATIVE NEGATIVE   Ketones, ur NEGATIVE NEGATIVE mg/dL   Protein, ur NEGATIVE NEGATIVE mg/dL   Nitrite NEGATIVE NEGATIVE   Leukocytes, UA NEGATIVE NEGATIVE   RBC / HPF 0-5 0 - 5 RBC/hpf   WBC, UA 0-5 0 - 5 WBC/hpf   Bacteria, UA NONE SEEN NONE SEEN   Squamous Epithelial / LPF NONE SEEN NONE SEEN   ____________________________________________  EKG My review and personal interpretation at Time: 20:50   Indication: tachycardia  Rate: 170  Rhythm: sinus Axis: normal Other: nonspecific st changes, no stemi, normal intervals ____________________________________________  RADIOLOGY  I personally reviewed all radiographic images ordered to evaluate for the above acute complaints and reviewed radiology reports and findings.  These findings were personally discussed with the patient.  Please see medical record for radiology report.  ____________________________________________   PROCEDURES  Procedure(s) performed:  Procedures    Critical Care performed:  no ____________________________________________   INITIAL IMPRESSION / ASSESSMENT AND PLAN / ED COURSE  Pertinent labs & imaging results that were available during my care of the patient were reviewed by me and considered in my medical decision making (see chart for details).  DDX: Overdose, tachycardia, dehydration, dysrhythmia, SVT, PE, sepsis  Alex Garrett is a 31 y.o. who presents to the ED with times as described above.  Patient does have sinus tachycardia mild hypertension.  Presentation most consistent with methamphetamine overdose given that he took 75 mg of Adderall.  Will give IV fluids.  We will check blood work.  Is not clinically consistent with pulmonary embolism.  Patient will require doses of Ativan for anxiolysis and monitoring in the ER.  Patient denies any SI and does not demonstrate any intent for self-harm.  Patient will be signed  out to oncoming physician pending further observation.      ____________________________________________   FINAL CLINICAL IMPRESSION(S) / ED DIAGNOSES  Final diagnoses:  Palpitations  Amphetamine abuse (HCC)      NEW MEDICATIONS STARTED DURING THIS VISIT:  This SmartLink is deprecated. Use AVSMEDLIST instead to display the medication list for a patient.   Note:  This document was prepared using Dragon voice recognition software and may include unintentional dictation errors.    Willy Eddy, MD 02/01/17 [phone]

## 2017-02-01 NOTE — ED Notes (Signed)
ED Provider at bedside. 

## 2017-02-01 NOTE — ED Notes (Signed)
Pt reports feeling less jittery, visible tremors remain, reports feeling continued lightheadedness and numbness.

## 2017-02-02 NOTE — ED Notes (Signed)
Pt. Verbalizes understanding of leaving AMA as discussed with MD Manson PasseyBrown, this RN, pt and pt father. Pt received extensive explanation of all risks with leaving AMA and verbalizes understanding of such, with persistent refusal of continued care. VS stable and pt denies pain. Pt. In NAD at time of d/c and denies further concerns regarding this visit. Pt. Stable at the time of departure from the unit, departing unit by the safest and most appropriate manner per that pt condition and limitations with all belongings accounted for. Pt advised to return to the ED at any time for emergent concerns, or for new/worsening symptoms.

## 2017-02-02 NOTE — ED Provider Notes (Signed)
At 12:05 AM patient requested to speak to the physician.  On my arrival to the room the patient is requesting to leave the emergency department AGAINST MEDICAL ADVICE.  I spoke with the patient at length explaining the risk of doing so including the possibility of death or permanent disability.  Patient adamantly requested to leave AMA.  Patient's father returned to the bedside where the patient voices request again.  Patient will be discharged from the emergency department in the custody of his father.   Darci CurrentBrown, Sunflower N, MD 02/02/17 [phone]

## 2022-06-11 ENCOUNTER — Encounter: Payer: Self-pay | Admitting: Emergency Medicine

## 2022-06-11 ENCOUNTER — Other Ambulatory Visit: Payer: Self-pay

## 2022-06-11 DIAGNOSIS — L03115 Cellulitis of right lower limb: Secondary | ICD-10-CM | POA: Diagnosis not present

## 2022-06-11 DIAGNOSIS — L03116 Cellulitis of left lower limb: Secondary | ICD-10-CM | POA: Insufficient documentation

## 2022-06-11 DIAGNOSIS — F1910 Other psychoactive substance abuse, uncomplicated: Secondary | ICD-10-CM | POA: Diagnosis not present

## 2022-06-11 DIAGNOSIS — J45909 Unspecified asthma, uncomplicated: Secondary | ICD-10-CM | POA: Diagnosis not present

## 2022-06-11 DIAGNOSIS — R21 Rash and other nonspecific skin eruption: Secondary | ICD-10-CM | POA: Diagnosis not present

## 2022-06-11 LAB — CBC WITH DIFFERENTIAL/PLATELET
Abs Immature Granulocytes: 0.01 10*3/uL (ref 0.00–0.07)
Basophils Absolute: 0 10*3/uL (ref 0.0–0.1)
Basophils Relative: 1 %
Eosinophils Absolute: 0.1 10*3/uL (ref 0.0–0.5)
Eosinophils Relative: 2 %
HCT: 37.6 % — ABNORMAL LOW (ref 39.0–52.0)
Hemoglobin: 12.6 g/dL — ABNORMAL LOW (ref 13.0–17.0)
Immature Granulocytes: 0 %
Lymphocytes Relative: 40 %
Lymphs Abs: 2.3 10*3/uL (ref 0.7–4.0)
MCH: 31.6 pg (ref 26.0–34.0)
MCHC: 33.5 g/dL (ref 30.0–36.0)
MCV: 94.2 fL (ref 80.0–100.0)
Monocytes Absolute: 0.6 10*3/uL (ref 0.1–1.0)
Monocytes Relative: 10 %
Neutro Abs: 2.8 10*3/uL (ref 1.7–7.7)
Neutrophils Relative %: 47 %
Platelets: 272 10*3/uL (ref 150–400)
RBC: 3.99 MIL/uL — ABNORMAL LOW (ref 4.22–5.81)
RDW: 13.7 % (ref 11.5–15.5)
WBC: 5.8 10*3/uL (ref 4.0–10.5)
nRBC: 0 % (ref 0.0–0.2)

## 2022-06-11 LAB — BASIC METABOLIC PANEL
Anion gap: 9 (ref 5–15)
BUN: 15 mg/dL (ref 6–20)
CO2: 26 mmol/L (ref 22–32)
Calcium: 9 mg/dL (ref 8.9–10.3)
Chloride: 102 mmol/L (ref 98–111)
Creatinine, Ser: 0.84 mg/dL (ref 0.61–1.24)
GFR, Estimated: >60 mL/min (ref >60)
Glucose, Bld: 114 mg/dL — ABNORMAL HIGH (ref 70–99)
Potassium: 3.6 mmol/L (ref 3.5–5.1)
Sodium: 137 mmol/L (ref 135–145)

## 2022-06-11 NOTE — ED Triage Notes (Signed)
Pt to ED via POV. Pt states approx 1 yr ago contracted MRSA/cellulitis. Pt states had a scab pop up to L lateral calf, approx 1 week ago his dog hit it with his nail and knocked the scab off. Pt with noted deep approx pinky nail sized deep wound to L lateral calf with small amounts of redness to surrounding area.

## 2022-06-12 ENCOUNTER — Emergency Department
Admission: EM | Admit: 2022-06-12 | Discharge: 2022-06-12 | Disposition: A | Discharge status: Home / Self Care | Payer: 59 | Attending: Emergency Medicine | Admitting: Emergency Medicine

## 2022-06-12 DIAGNOSIS — L03119 Cellulitis of unspecified part of limb: Secondary | ICD-10-CM

## 2022-06-12 DIAGNOSIS — F1990 Other psychoactive substance use, unspecified, uncomplicated: Secondary | ICD-10-CM

## 2022-06-12 MED ORDER — SULFAMETHOXAZOLE-TRIMETHOPRIM 800-160 MG PO TABS
1.0000 | ORAL_TABLET | Freq: Once | ORAL | Status: AC
  Administered 2022-06-12: 1 via ORAL
  Filled 2022-06-12: qty 1

## 2022-06-12 MED ORDER — BACITRACIN ZINC 500 UNIT/GM EX OINT
TOPICAL_OINTMENT | Freq: Once | CUTANEOUS | Status: AC
  Filled 2022-06-12: qty 0.9

## 2022-06-12 MED ORDER — SULFAMETHOXAZOLE-TRIMETHOPRIM 800-160 MG PO TABS: 1.0000 | ORAL_TABLET | Freq: Two times a day (BID) | ORAL | 0 refills | Status: DC

## 2022-06-12 MED ORDER — CEPHALEXIN 500 MG PO CAPS: 500.0000 mg | ORAL_CAPSULE | Freq: Four times a day (QID) | ORAL | 0 refills | Status: AC

## 2022-06-12 MED ORDER — CEPHALEXIN 500 MG PO CAPS: 500.0000 mg | ORAL_CAPSULE | Freq: Once | ORAL | Status: DC

## 2022-06-12 NOTE — ED Notes (Signed)
PT brought to ed rm 11 at this time, this RN now assuming care.

## 2022-06-12 NOTE — ED Notes (Signed)
Dressing placed to calf wound and pt had a scab to the R thigh as well. Dressing placed to that as well.

## 2022-06-12 NOTE — Discharge Instructions (Signed)
You may alternate Tylenol 1000 mg every 6 hours as needed for pain, fever and Ibuprofen 800 mg every 6-8 hours as needed for pain, fever.  Please take Ibuprofen with food.  Do not take more than 4000 mg of Tylenol (acetaminophen) in a 24 hour period. ° °

## 2022-06-12 NOTE — ED Notes (Signed)
ED Provider at bedside. 

## 2022-06-12 NOTE — ED Provider Notes (Signed)
[facility] Provider Note    Event Date/Time   First MD Initiated Contact with Patient 06/12/22 0110     (approximate)   History   Rash   HPI  Alex Garrett is a 37 y.o. male with history of IV drug abuse, asthma who presents to the emergency department with a small ulcerated lesion to the left lateral calf and a scabbed lesion to the right anterior thigh.  He has had these before after injecting methamphetamine into his legs.  He states that 1 area on his left leg is slightly red and warm but is nontender and is not draining.  He denies any fevers.  No history of diabetes.  States he took 2 of his sisters Keflex prior to coming to the ED.   History provided by patient.    Past Medical History:  Diagnosis Date   Asthma     Past Surgical History:  Procedure Laterality Date   APPENDECTOMY     WISDOM TOOTH EXTRACTION     WRIST SURGERY      MEDICATIONS:  Prior to Admission medications   Medication Sig Start Date End Date Taking? Authorizing Provider  amoxicillin (AMOXIL) 500 MG capsule Take 1 capsule (500 mg total) by mouth 3 (three) times daily. 10/18/14   Joni Reining, PA-C  cyclobenzaprine (FLEXERIL) 10 MG tablet Take 1 tablet (10 mg total) by mouth every 8 (eight) hours as needed for muscle spasms. 10/18/14   Joni Reining, PA-C    Physical Exam   Triage Vital Signs: ED Triage Vitals  Enc Vitals Group     BP 06/11/22 2312 (!) 144/87     Pulse Rate 06/11/22 2312 99     Resp 06/11/22 2312 18     Temp 06/11/22 2312 98.5 F (36.9 C)     Temp Source 06/11/22 2312 Oral     SpO2 06/11/22 2312 97 %     Weight 06/11/22 2317 145 lb 1 oz (65.8 kg)     Height 06/11/22 2317  (1.803 m)     Head Circumference --      Peak Flow --      Pain Score 06/11/22 2317 0     Pain Loc --      Pain Edu? --      Excl. in GC? --     Most recent vital signs: Vitals:   06/11/22 2312 06/12/22 0205  BP: (!) 144/87   Pulse: 99 92  Resp: 18  16  Temp: 98.5 F (36.9 C)   SpO2: 97% 96%    CONSTITUTIONAL: Alert, responds appropriately to questions.  Thin, appears older than stated age, pleasant HEAD: Normocephalic, atraumatic EYES: Conjunctivae clear, pupils appear equal, sclera nonicteric ENT: normal nose; moist mucous membranes NECK: Supple, normal ROM CARD: RRR; S1 and S2 appreciated RESP: Normal chest excursion without splinting or tachypnea; breath sounds clear and equal bilaterally; no wheezes, no rhonchi, no rales, no hypoxia or respiratory distress, speaking full sentences ABD/GI: Non-distended; soft, non-tender, no rebound, no guarding, no peritoneal signs BACK: The back appears normal EXT: Multiple scars to the bilateral lower extremities.  He has about a 8 x 6 mm open area to the left lateral calf with minimal surrounding redness and warmth but no drainage, fluctuance or tenderness.  He also has a scabbed lesion with surrounding redness and warmth without fluctuance or induration that is about 1 x 1 cm noted to the right anterior thigh.  2+ DP pulses  bilaterally.  No calf tenderness or calf swelling.  Compartments soft. SKIN: Normal color for age and race; warm; no rash on exposed skin NEURO: Moves all extremities equally, normal speech PSYCH: The patient's mood and manner are appropriate.    Left distal lower extremity    Right anterior thigh   Patient gave verbal permission to utilize photo for medical documentation only. The image was not stored on any personal device.  ED Results / Procedures / Treatments   LABS: (all labs ordered are listed, but only abnormal results are displayed) Labs Reviewed  CBC WITH DIFFERENTIAL/PLATELET - Abnormal; Notable for the following components:      Result Value   RBC 3.99 (*)    Hemoglobin 12.6 (*)    HCT 37.6 (*)    All other components within normal limits  BASIC METABOLIC PANEL - Abnormal; Notable for the following components:   Glucose, Bld 114 (*)    All other  components within normal limits     EKG:   RADIOLOGY: My personal review and interpretation of imaging:    I have personally reviewed all radiology reports.   No results found.   PROCEDURES:  Critical Care performed: No     Procedures    IMPRESSION / MDM / ASSESSMENT AND PLAN / ED COURSE  I reviewed the triage vital signs and the nursing notes.    Patient here with wounds to his bilateral lower extremities with minimal surrounding cellulitis.  Does inject methamphetamine and fentanyl.  No systemic symptoms.     DIFFERENTIAL DIAGNOSIS (includes but not limited to):   Wounds from IV drug use, mild cellulitis, no abscess, no compartment syndrome   Patient's presentation is most consistent with acute complicated illness / injury requiring diagnostic workup.   PLAN: Labs obtained from triage.  No leukocytosis, normal glucose, normal electrolytes.  Patient is nontoxic in appearance and afebrile.  No systemic symptoms.  He does report having history of MRSA.  Will give him Keflex and Bactrim for broad coverage.  Will place bacitracin ointment to the wound and wet-to-dry dressing.  Recommended regular dressing changes.  Recommended Tylenol, Motrin for pain control if needed.  Neurovascular intact distally.  Patient is comfortable with plan for discharge.   MEDICATIONS GIVEN IN ED: Medications  sulfamethoxazole-trimethoprim (BACTRIM DS) 800-160 MG per tablet 1 tablet (1 tablet Oral Given 06/12/22 0152)  bacitracin ointment ( Topical Given 06/12/22 0153)     ED COURSE:  At this time, I do not feel there is any life-threatening condition present. I reviewed all nursing notes, vitals, pertinent previous records.  All lab and urine results, EKGs, imaging ordered have been independently reviewed and interpreted by myself.  I reviewed all available radiology reports from any imaging ordered this visit.  Based on my assessment, I feel the patient is safe to be discharged home without  further emergent workup and can continue workup as an outpatient as needed. Discussed all findings, treatment plan as well as usual and customary return precautions.  They verbalize understanding and are comfortable with this plan.  Outpatient follow-up has been provided as needed.  All questions have been answered.    CONSULTS:  none   OUTSIDE RECORDS REVIEWED: Reviewed last family medicine visit on 12/09/2021.       FINAL CLINICAL IMPRESSION(S) / ED DIAGNOSES   Final diagnoses:  Cellulitis of lower extremity, unspecified laterality  IV drug user     Rx / DC Orders   ED Discharge Orders  Ordered    sulfamethoxazole-trimethoprim (BACTRIM DS) 800-160 MG tablet  2 times daily        06/12/22 0136    cephALEXin (KEFLEX) 500 MG capsule  4 times daily        06/12/22 0136             Note:  This document was prepared using Dragon voice recognition software and may include unintentional dictation errors.   Cicley Ganesh, Layla Maw, DO 06/12/22 [phone]

## 2022-09-28 ENCOUNTER — Other Ambulatory Visit: Payer: Self-pay

## 2022-09-28 ENCOUNTER — Emergency Department
Admission: EM | Admit: 2022-09-28 | Discharge: 2022-09-29 | Disposition: A | Discharge status: Home / Self Care | Payer: 59 | Attending: Emergency Medicine | Admitting: Emergency Medicine

## 2022-09-28 DIAGNOSIS — Z743 Need for continuous supervision: Secondary | ICD-10-CM | POA: Diagnosis not present

## 2022-09-28 DIAGNOSIS — T40601A Poisoning by unspecified narcotics, accidental (unintentional), initial encounter: Secondary | ICD-10-CM

## 2022-09-28 DIAGNOSIS — T402X1A Poisoning by other opioids, accidental (unintentional), initial encounter: Secondary | ICD-10-CM | POA: Insufficient documentation

## 2022-09-28 DIAGNOSIS — R404 Transient alteration of awareness: Secondary | ICD-10-CM | POA: Diagnosis not present

## 2022-09-28 DIAGNOSIS — R0689 Other abnormalities of breathing: Secondary | ICD-10-CM | POA: Diagnosis not present

## 2022-09-28 DIAGNOSIS — J45909 Unspecified asthma, uncomplicated: Secondary | ICD-10-CM | POA: Insufficient documentation

## 2022-09-28 DIAGNOSIS — R4182 Altered mental status, unspecified: Secondary | ICD-10-CM | POA: Diagnosis not present

## 2022-09-28 DIAGNOSIS — R000 Tachycardia, unspecified: Secondary | ICD-10-CM | POA: Diagnosis not present

## 2022-09-28 DIAGNOSIS — R55 Syncope and collapse: Secondary | ICD-10-CM | POA: Diagnosis not present

## 2022-09-28 MED ORDER — ONDANSETRON HCL 4 MG/2ML IJ SOLN
4.0000 mg | Freq: Once | INTRAMUSCULAR | Status: AC
  Administered 2022-09-28: 4 mg via INTRAVENOUS
  Filled 2022-09-28: qty 2

## 2022-09-28 NOTE — ED Provider Notes (Signed)
[facility] Provider Note    Event Date/Time   First MD Initiated Contact with Patient 09/28/22 2306     (approximate)   History   Chief Complaint: loss of consciousness  HPI  Alex Garrett is a 37 y.o. male with a history of asthma who is brought to the ED by EMS due to opiate overdose.  Patient was found unconscious on the ground on the side of the road, EMS gave 6 mg of Narcan with return of normal mental status.  Patient was then answering questions and told EMS that he snorted fentanyl.  Currently patient is not answering any questions and does not provide any additional history     Physical Exam   Triage Vital Signs: ED Triage Vitals  Encounter Vitals Group     BP      Systolic BP Percentile      Diastolic BP Percentile      Pulse      Resp      Temp      Temp src      SpO2      Weight      Height      Head Circumference      Peak Flow      Pain Score      Pain Loc      Pain Education      Exclude from Growth Chart     Most recent vital signs: Vitals:   09/29/22 0500 09/29/22 0515  BP: 114/63   Pulse: (!) 57 72  Resp: 16 12  Temp:    SpO2: 100% 100%    General: Awake, alert, no distress.  CV:  Good peripheral perfusion.  Tachycardia heart rate 100.  Normal distal pulses Resp:  Normal effort.  Clear to auscultation bilaterally, no crackles or wheezing Abd:  No distention.  Soft nontender Other:  PERRL, EOMI, no signs of trauma.  No midline spinal tenderness.  Multiple skin lesions since issues for prior skin popping wounds.   ED Results / Procedures / Treatments   Labs (all labs ordered are listed, but only abnormal results are displayed) Labs Reviewed  COMPREHENSIVE METABOLIC PANEL - Abnormal; Notable for the following components:      Result Value   Total Protein 8.2 (*)    Total Bilirubin 1.3 (*)    All other components within normal limits  CBC WITH DIFFERENTIAL/PLATELET  CBC WITH DIFFERENTIAL/PLATELET   URINE DRUG SCREEN, QUALITATIVE ([facility] ONLY)     EKG Interpreted by me Sinus rhythm rate of 80.  Right axis, normal intervals.  Poor R wave progression.  Normal ST segments and T waves.  Slight J-point elevation consistent with early repolarization.  No ischemic changes   RADIOLOGY    PROCEDURES:  Procedures   MEDICATIONS ORDERED IN ED: Medications  naloxone (NARCAN) nasal spray 4 mg/0.1 mL (has no administration in time range)  ondansetron (ZOFRAN) injection 4 mg (4 mg Intravenous Given 09/28/22 2323)     IMPRESSION / MDM / ASSESSMENT AND PLAN / ED COURSE  I reviewed the triage vital signs and the nursing notes.  DDx: Opiate overdose, AKI, electrolyte abnormality, anemia, arrhythmia  Patient's presentation is most consistent with acute presentation with potential threat to life or bodily function.  Patient presents with likely opiate overdose by patient's own report to EMS that he snorted fentanyl, and rapid improvement with Narcan.  Currently he is nontoxic but not providing additional history.  Will check labs, EKG.  Will need to monitor in ED to ensure no declining mental status.   ----------------------------------------- 5:34 AM on 09/29/2022 ----------------------------------------- Remains calm and comfortable, breathing unlabored.  Vital signs normal.  No evidence of relapsing opioid toxicity.  Normal mental status, doubt CNS trauma or infection.  Stable for discharge      FINAL CLINICAL IMPRESSION(S) / ED DIAGNOSES   Final diagnoses:  Opiate overdose, accidental or unintentional, initial encounter [facility])     Rx / DC Orders   ED Discharge Orders     None        Note:  This document was prepared using Dragon voice recognition software and may include unintentional dictation errors.   Sharman Cheek, MD 09/29/22 [phone]

## 2022-09-28 NOTE — ED Triage Notes (Signed)
Pt bib EMS after report pt was lying on the side of the road. Pt received 6mg  Narcan en route. Pt not cooperative with staff care on arrival lying in the fetal position.

## 2022-09-29 DIAGNOSIS — T402X1A Poisoning by other opioids, accidental (unintentional), initial encounter: Secondary | ICD-10-CM | POA: Diagnosis not present

## 2022-09-29 LAB — COMPREHENSIVE METABOLIC PANEL
ALT: 19 U/L (ref 0–44)
AST: 30 U/L (ref 15–41)
Albumin: 4.5 g/dL (ref 3.5–5.0)
Alkaline Phosphatase: 63 U/L (ref 38–126)
Anion gap: 11 (ref 5–15)
BUN: 15 mg/dL (ref 6–20)
CO2: 24 mmol/L (ref 22–32)
Calcium: 9.4 mg/dL (ref 8.9–10.3)
Chloride: 105 mmol/L (ref 98–111)
Creatinine, Ser: 1.02 mg/dL (ref 0.61–1.24)
GFR, Estimated: >60 mL/min (ref >60)
Glucose, Bld: 76 mg/dL (ref 70–99)
Potassium: 4.5 mmol/L (ref 3.5–5.1)
Sodium: 140 mmol/L (ref 135–145)
Total Bilirubin: 1.3 mg/dL — ABNORMAL HIGH (ref 0.3–1.2)
Total Protein: 8.2 g/dL — ABNORMAL HIGH (ref 6.5–8.1)

## 2022-09-29 LAB — CBC WITH DIFFERENTIAL/PLATELET
Abs Immature Granulocytes: 0.02 10*3/uL (ref 0.00–0.07)
Basophils Absolute: 0.1 10*3/uL (ref 0.0–0.1)
Basophils Relative: 1 %
Eosinophils Absolute: 0.2 10*3/uL (ref 0.0–0.5)
Eosinophils Relative: 2 %
HCT: 46.5 % (ref 39.0–52.0)
Hemoglobin: 15.2 g/dL (ref 13.0–17.0)
Immature Granulocytes: 0 %
Lymphocytes Relative: 20 %
Lymphs Abs: 1.9 10*3/uL (ref 0.7–4.0)
MCH: 30.5 pg (ref 26.0–34.0)
MCHC: 32.7 g/dL (ref 30.0–36.0)
MCV: 93.2 fL (ref 80.0–100.0)
Monocytes Absolute: 0.6 10*3/uL (ref 0.1–1.0)
Monocytes Relative: 6 %
Neutro Abs: 7 10*3/uL (ref 1.7–7.7)
Neutrophils Relative %: 71 %
Platelets: 283 10*3/uL (ref 150–400)
RBC: 4.99 MIL/uL (ref 4.22–5.81)
RDW: 12.9 % (ref 11.5–15.5)
WBC: 9.8 10*3/uL (ref 4.0–10.5)
nRBC: 0 % (ref 0.0–0.2)

## 2022-09-29 MED ORDER — NALOXONE HCL 4 MG/0.1ML NA LIQD
1.0000 | Freq: Once | NASAL | Status: AC
  Administered 2022-09-29: 1 via NASAL
  Filled 2022-09-29: qty 4

## 2022-09-29 NOTE — ED Notes (Signed)
Patient lying in bed with eyes closed. Resp even and unlabored. Patient responsive to pain and groans to speech.  Chest rise and fall noted. Step father still at bedside at this time.

## 2022-09-29 NOTE — ED Notes (Signed)
Patient to become more alert at this time. Patient sts that he believes that he took fentanyl.Marland Kitchen

## 2022-09-29 NOTE — ED Notes (Signed)
Patient lying on stretcher at this time with eyes closed. Resp even and nonlabored. Chest rise and fall noted.  This RN to ask patient, "how he was doing?" Patient's sts he feels tired.

## 2022-10-06 ENCOUNTER — Other Ambulatory Visit: Payer: Self-pay

## 2022-10-06 ENCOUNTER — Emergency Department
Admission: EM | Admit: 2022-10-06 | Discharge: 2022-10-07 | Disposition: A | Discharge status: Home / Self Care | Payer: 59 | Attending: Emergency Medicine | Admitting: Emergency Medicine

## 2022-10-06 DIAGNOSIS — F17210 Nicotine dependence, cigarettes, uncomplicated: Secondary | ICD-10-CM | POA: Diagnosis not present

## 2022-10-06 DIAGNOSIS — L02416 Cutaneous abscess of left lower limb: Secondary | ICD-10-CM | POA: Insufficient documentation

## 2022-10-06 DIAGNOSIS — L03116 Cellulitis of left lower limb: Secondary | ICD-10-CM | POA: Diagnosis not present

## 2022-10-06 DIAGNOSIS — J45909 Unspecified asthma, uncomplicated: Secondary | ICD-10-CM | POA: Insufficient documentation

## 2022-10-06 DIAGNOSIS — M7989 Other specified soft tissue disorders: Secondary | ICD-10-CM | POA: Diagnosis not present

## 2022-10-06 LAB — CBC WITH DIFFERENTIAL/PLATELET
Abs Immature Granulocytes: 0.02 10*3/uL (ref 0.00–0.07)
Basophils Absolute: 0.1 10*3/uL (ref 0.0–0.1)
Basophils Relative: 1 %
Eosinophils Absolute: 0.5 10*3/uL (ref 0.0–0.5)
Eosinophils Relative: 6 %
HCT: 39.1 % (ref 39.0–52.0)
Hemoglobin: 13 g/dL (ref 13.0–17.0)
Immature Granulocytes: 0 %
Lymphocytes Relative: 18 %
Lymphs Abs: 1.4 10*3/uL (ref 0.7–4.0)
MCH: 31.3 pg (ref 26.0–34.0)
MCHC: 33.2 g/dL (ref 30.0–36.0)
MCV: 94 fL (ref 80.0–100.0)
Monocytes Absolute: 0.8 10*3/uL (ref 0.1–1.0)
Monocytes Relative: 10 %
Neutro Abs: 5.2 10*3/uL (ref 1.7–7.7)
Neutrophils Relative %: 65 %
Platelets: 282 10*3/uL (ref 150–400)
RBC: 4.16 MIL/uL — ABNORMAL LOW (ref 4.22–5.81)
RDW: 12.8 % (ref 11.5–15.5)
WBC: 7.9 10*3/uL (ref 4.0–10.5)
nRBC: 0 % (ref 0.0–0.2)

## 2022-10-06 LAB — COMPREHENSIVE METABOLIC PANEL
ALT: 18 U/L (ref 0–44)
AST: 21 U/L (ref 15–41)
Albumin: 3.7 g/dL (ref 3.5–5.0)
Alkaline Phosphatase: 63 U/L (ref 38–126)
Anion gap: 10 (ref 5–15)
BUN: 9 mg/dL (ref 6–20)
CO2: 26 mmol/L (ref 22–32)
Calcium: 8.9 mg/dL (ref 8.9–10.3)
Chloride: 99 mmol/L (ref 98–111)
Creatinine, Ser: 0.7 mg/dL (ref 0.61–1.24)
GFR, Estimated: >60 mL/min (ref >60)
Glucose, Bld: 105 mg/dL — ABNORMAL HIGH (ref 70–99)
Potassium: 4 mmol/L (ref 3.5–5.1)
Sodium: 135 mmol/L (ref 135–145)
Total Bilirubin: 0.3 mg/dL (ref 0.3–1.2)
Total Protein: 7.3 g/dL (ref 6.5–8.1)

## 2022-10-06 LAB — LACTIC ACID, PLASMA: Lactic Acid, Venous: 1.4 mmol/L (ref 0.5–1.9)

## 2022-10-06 MED ORDER — LIDOCAINE HCL (PF) 1 % IJ SOLN
10.0000 mL | Freq: Once | INTRAMUSCULAR | Status: AC
  Administered 2022-10-06: 10 mL
  Filled 2022-10-06: qty 10

## 2022-10-06 NOTE — ED Triage Notes (Addendum)
Pt c/o abscess on back of left leg that he noticed about 9 days ago. Pt reports IV drug use/dirty needle as possible cause. Abscess appears baseball sized, swollen, red, and warm to touch, no drainage noted or reported.

## 2022-10-06 NOTE — ED Provider Notes (Signed)
[name] Provider Note    Event Date/Time   First MD Initiated Contact with Patient 10/06/22 2308     (approximate)   History   Abscess   HPI  Alex Garrett is a 37 y.o. male   Past medical history of IV drug user, asthma who presents to the emergency department with an abscess on his left posterior thigh.  Several days ago noted some redness and swelling to the area which is worsened.  He denies systemic symptoms of fevers or chills.  He does inject to that site occasionally.  No other acute medical complaints  External Medical Documents Reviewed: [facility] emergency department note dated earlier today where he was evaluated for cellulitis of the left leg but left prior to blood work and imaging.  He was given a prescription for Bactrim and Keflex at that time.      Physical Exam   Triage Vital Signs: ED Triage Vitals  Encounter Vitals Group     BP 10/06/22 2008 (!) 140/82     Systolic BP Percentile --      Diastolic BP Percentile --      Pulse Rate 10/06/22 2008 90     Resp 10/06/22 2008 16     Temp 10/06/22 2008 97.9 F (36.6 C)     Temp Source 10/06/22 2008 Oral     SpO2 10/06/22 2008 100 %     Weight 10/06/22 2230 133 lb 12.8 oz (60.7 kg)     Height 10/06/22 2230 5\' 11"  (1.803 m)     Head Circumference --      Peak Flow --      Pain Score 10/06/22 2008 5     Pain Loc --      Pain Education --      Exclude from Growth Chart --     Most recent vital signs: Vitals:   10/06/22 2341 10/07/22 0000  BP: 129/77 138/89  Pulse: 89 96  Resp: 17   Temp: 98.4 F (36.9 C)   SpO2: 100% 100%    General: Awake, no distress.  CV:  Good peripheral perfusion.  Resp:  Normal effort.  Abd:  No distention.  Other:  Cellulitic changes surrounding a 3 cm fluctuant mass to the left posterior thigh.  No crepitus or pain out of proportion.  Patient appears nontoxic and comfortable with normal vital signs and no fever.  Fluid collection noted on  bedside ultrasound consistent with abscess.   ED Results / Procedures / Treatments   Labs (all labs ordered are listed, but only abnormal results are displayed) Labs Reviewed  CBC WITH DIFFERENTIAL/PLATELET - Abnormal; Notable for the following components:      Result Value   RBC 4.16 (*)    All other components within normal limits  COMPREHENSIVE METABOLIC PANEL - Abnormal; Notable for the following components:   Glucose, Bld 105 (*)    All other components within normal limits  LACTIC ACID, PLASMA     I ordered and reviewed the above labs they are notable for blood cell count and lactic acid is normal.  PROCEDURES:  Critical Care performed: No  ..Incision and Drainage  Date/Time: 10/07/2022 12:37 AM  Performed by: Pilar Jarvis, MD Authorized by: Pilar Jarvis, MD   Consent:    Consent obtained:  Verbal   Consent given by:  Patient   Risks, benefits, and alternatives were discussed: yes     Risks discussed:  Incomplete drainage, infection, damage to other  organs, bleeding and pain   Alternatives discussed:  No treatment Universal protocol:    Procedure explained and questions answered to patient or proxy's satisfaction: yes     Test results available : yes     Patient identity confirmed:  Verbally with patient Location:    Type:  Abscess   Size:  3cm   Location:  Lower extremity   Lower extremity location:  Leg   Leg location:  L upper leg Pre-procedure details:    Skin preparation:  Chlorhexidine with alcohol Sedation:    Sedation type:  None Anesthesia:    Anesthesia method:  Local infiltration   Local anesthetic:  Lidocaine 1% w/o epi Procedure type:    Complexity:  Complex Procedure details:    Ultrasound guidance: no     Needle aspiration: no     Incision types:  Stab incision   Incision depth:  Dermal   Wound management:  Probed and deloculated   Drainage:  Bloody and purulent   Drainage amount:  Moderate   Wound treatment:  Wound left open    Packing materials:  None Post-procedure details:    Procedure completion:  Tolerated    MEDICATIONS ORDERED IN ED: Medications  lidocaine (PF) (XYLOCAINE) 1 % injection 10 mL (10 mLs Other Given by Other 10/06/22 2325)  sulfamethoxazole-trimethoprim (BACTRIM DS) 800-160 MG per tablet 1 tablet (1 tablet Oral Given 10/07/22 0012)  cephALEXin (KEFLEX) capsule 500 mg (500 mg Oral Given 10/07/22 0012)     IMPRESSION / MDM / ASSESSMENT AND PLAN / ED COURSE  I reviewed the triage vital signs and the nursing notes.                                Patient's presentation is most consistent with acute presentation with potential threat to life or bodily function.  Differential diagnosis includes, but is not limited to, abscess, cellulitis, necrotizing fasciitis or sepsis   The patient is on the cardiac monitor to evaluate for evidence of arrhythmia and/or significant heart rate changes.  MDM:    Patient with abscess and surrounding cellulitis that looks comfortable nontoxic and doubt sepsis.  Clinical exam not consistent with necrotizing fasciitis.    Labs unremarkable with no significant leukocytosis or lactic acidosis.  Drained as above.    Already on Keflex and Bactrim for 10-day course, continue taking, monitor for improvement, PMD follow-up and return with worsening.       FINAL CLINICAL IMPRESSION(S) / ED DIAGNOSES   Final diagnoses:  Cellulitis and abscess of left leg     Rx / DC Orders   ED Discharge Orders     None        Note:  This document was prepared using Dragon voice recognition software and may include unintentional dictation errors.    Pilar Jarvis, MD 10/07/22 [phone]

## 2022-10-07 DIAGNOSIS — L03116 Cellulitis of left lower limb: Secondary | ICD-10-CM | POA: Diagnosis not present

## 2022-10-07 MED ORDER — SULFAMETHOXAZOLE-TRIMETHOPRIM 800-160 MG PO TABS
1.0000 | ORAL_TABLET | Freq: Once | ORAL | Status: AC
  Administered 2022-10-07: 1 via ORAL
  Filled 2022-10-07: qty 1

## 2022-10-07 MED ORDER — CEPHALEXIN 500 MG PO CAPS
500.0000 mg | ORAL_CAPSULE | Freq: Once | ORAL | Status: AC
  Administered 2022-10-07: 500 mg via ORAL
  Filled 2022-10-07: qty 1

## 2022-10-07 NOTE — Discharge Instructions (Signed)
Please keep your wound clean by washing at least daily with soap and water. If you see any signs of infection like spreading redness, pus coming from the wound, extreme pain, fevers, chills or any other worsening doctor right away or come back to the emergency department  Apply antibiotic ointment and a bandage daily.  Take your antibiotics as prescribed.  Thank you for choosing [place] for your health care today!  Please see your primary doctor this week for a follow up appointment.   If you have any new, worsening, or unexpected symptoms call your doctor right away or come back to the emergency department for reevaluation.  It was my pleasure to care for you today.   Daneil Dan Modesto Charon, MD

## 2022-12-19 ENCOUNTER — Emergency Department
Admission: EM | Admit: 2022-12-19 | Discharge: 2022-12-20 | Disposition: A | Discharge status: Home / Self Care | Payer: 59 | Attending: Emergency Medicine | Admitting: Emergency Medicine

## 2022-12-19 ENCOUNTER — Emergency Department: Payer: 59

## 2022-12-19 ENCOUNTER — Encounter: Payer: Self-pay | Admitting: *Deleted

## 2022-12-19 ENCOUNTER — Other Ambulatory Visit: Payer: Self-pay

## 2022-12-19 DIAGNOSIS — S02401A Maxillary fracture, unspecified, initial encounter for closed fracture: Secondary | ICD-10-CM | POA: Diagnosis not present

## 2022-12-19 DIAGNOSIS — R040 Epistaxis: Secondary | ICD-10-CM | POA: Diagnosis not present

## 2022-12-19 DIAGNOSIS — S0990XA Unspecified injury of head, initial encounter: Secondary | ICD-10-CM | POA: Diagnosis not present

## 2022-12-19 DIAGNOSIS — S0240CA Maxillary fracture, right side, initial encounter for closed fracture: Secondary | ICD-10-CM | POA: Insufficient documentation

## 2022-12-19 DIAGNOSIS — M47812 Spondylosis without myelopathy or radiculopathy, cervical region: Secondary | ICD-10-CM | POA: Diagnosis not present

## 2022-12-19 DIAGNOSIS — J45909 Unspecified asthma, uncomplicated: Secondary | ICD-10-CM | POA: Diagnosis not present

## 2022-12-19 DIAGNOSIS — W228XXA Striking against or struck by other objects, initial encounter: Secondary | ICD-10-CM | POA: Diagnosis not present

## 2022-12-19 DIAGNOSIS — R220 Localized swelling, mass and lump, head: Secondary | ICD-10-CM | POA: Diagnosis not present

## 2022-12-19 DIAGNOSIS — S0285XA Fracture of orbit, unspecified, initial encounter for closed fracture: Secondary | ICD-10-CM | POA: Diagnosis not present

## 2022-12-19 DIAGNOSIS — S0231XA Fracture of orbital floor, right side, initial encounter for closed fracture: Secondary | ICD-10-CM | POA: Diagnosis not present

## 2022-12-19 DIAGNOSIS — M4802 Spinal stenosis, cervical region: Secondary | ICD-10-CM | POA: Diagnosis not present

## 2022-12-19 DIAGNOSIS — S199XXA Unspecified injury of neck, initial encounter: Secondary | ICD-10-CM | POA: Diagnosis not present
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
## 2022-12-20 NOTE — Discharge Instructions (Addendum)
You may alternate Tylenol 1000 mg every 6 hours as needed for pain, fever and Ibuprofen 800 mg every 6-8 hours as needed for pain, fever.  Please take Ibuprofen with food.  Do not take more than 4000 mg of Tylenol (acetaminophen) in a 24 hour period. ° °

## 2023-02-02 DIAGNOSIS — F1120 Opioid dependence, uncomplicated: Secondary | ICD-10-CM | POA: Diagnosis not present

## 2023-02-02 DIAGNOSIS — F1320 Sedative, hypnotic or anxiolytic dependence, uncomplicated: Secondary | ICD-10-CM | POA: Diagnosis not present

## 2023-07-16 ENCOUNTER — Emergency Department
Admission: EM | Admit: 2023-07-16 | Discharge: 2023-07-16 | Disposition: A | Discharge status: Home / Self Care | Payer: MEDICAID | Attending: Emergency Medicine | Admitting: Emergency Medicine

## 2023-07-16 ENCOUNTER — Encounter: Payer: Self-pay | Admitting: Emergency Medicine

## 2023-07-16 ENCOUNTER — Emergency Department: Payer: MEDICAID

## 2023-07-16 ENCOUNTER — Other Ambulatory Visit: Payer: Self-pay

## 2023-07-16 DIAGNOSIS — S0990XA Unspecified injury of head, initial encounter: Secondary | ICD-10-CM

## 2023-07-16 DIAGNOSIS — W2203XA Walked into furniture, initial encounter: Secondary | ICD-10-CM | POA: Insufficient documentation

## 2023-07-16 DIAGNOSIS — S0101XA Laceration without foreign body of scalp, initial encounter: Secondary | ICD-10-CM | POA: Insufficient documentation

## 2023-07-16 NOTE — Discharge Instructions (Signed)
 Please return if you develop any vomiting, visual changes, difficulty walking, neck pain, numbness or tingling, or any other concerns.  It was a pleasure caring for you today.
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
## 2024-02-24 ENCOUNTER — Encounter: Payer: Self-pay | Admitting: Emergency Medicine

## 2024-02-24 ENCOUNTER — Other Ambulatory Visit: Payer: Self-pay

## 2024-02-24 ENCOUNTER — Emergency Department: Payer: MEDICAID

## 2024-02-24 DIAGNOSIS — K625 Hemorrhage of anus and rectum: Secondary | ICD-10-CM | POA: Diagnosis present

## 2024-02-24 DIAGNOSIS — Z5189 Encounter for other specified aftercare: Secondary | ICD-10-CM | POA: Diagnosis not present

## 2024-02-24 DIAGNOSIS — Z5321 Procedure and treatment not carried out due to patient leaving prior to being seen by health care provider: Secondary | ICD-10-CM | POA: Diagnosis not present

## 2024-02-24 LAB — COMPREHENSIVE METABOLIC PANEL WITH GFR
ALT: 39 U/L (ref 0–44)
AST: 45 U/L — ABNORMAL HIGH (ref 15–41)
Albumin: 4.3 g/dL (ref 3.5–5.0)
Alkaline Phosphatase: 102 U/L (ref 38–126)
Anion gap: 10 (ref 5–15)
BUN: 13 mg/dL (ref 6–20)
CO2: 27 mmol/L (ref 22–32)
Calcium: 9.1 mg/dL (ref 8.9–10.3)
Chloride: 102 mmol/L (ref 98–111)
Creatinine, Ser: 0.99 mg/dL (ref 0.61–1.24)
GFR, Estimated: >60 mL/min
Glucose, Bld: 82 mg/dL (ref 70–99)
Potassium: 4.8 mmol/L (ref 3.5–5.1)
Sodium: 139 mmol/L (ref 135–145)
Total Bilirubin: <0.2 mg/dL (ref 0.0–1.2)
Total Protein: 7.5 g/dL (ref 6.5–8.1)

## 2024-02-24 LAB — CBC WITH DIFFERENTIAL/PLATELET
Abs Immature Granulocytes: 0.03 K/uL (ref 0.00–0.07)
Basophils Absolute: 0.1 K/uL (ref 0.0–0.1)
Basophils Relative: 1 %
Eosinophils Absolute: 0.4 K/uL (ref 0.0–0.5)
Eosinophils Relative: 8 %
HCT: 45 % (ref 39.0–52.0)
Hemoglobin: 14.9 g/dL (ref 13.0–17.0)
Immature Granulocytes: 1 %
Lymphocytes Relative: 34 %
Lymphs Abs: 1.7 K/uL (ref 0.7–4.0)
MCH: 32.2 pg (ref 26.0–34.0)
MCHC: 33.1 g/dL (ref 30.0–36.0)
MCV: 97.2 fL (ref 80.0–100.0)
Monocytes Absolute: 0.5 K/uL (ref 0.1–1.0)
Monocytes Relative: 11 %
Neutro Abs: 2.3 K/uL (ref 1.7–7.7)
Neutrophils Relative %: 45 %
Platelets: 236 K/uL (ref 150–400)
RBC: 4.63 MIL/uL (ref 4.22–5.81)
RDW: 12.7 % (ref 11.5–15.5)
WBC: 5.1 K/uL (ref 4.0–10.5)
nRBC: 0 % (ref 0.0–0.2)
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
## 2024-02-24 NOTE — ED Notes (Signed)
 Patient difficult lab stick, called phlebotomy to obtain lab work.

## 2024-02-25 ENCOUNTER — Emergency Department
Admission: EM | Admit: 2024-02-25 | Discharge: 2024-02-25 | Payer: MEDICAID | Attending: Emergency Medicine | Admitting: Emergency Medicine
